# Patient Record
Sex: Male | Born: 1955 | Race: White | Hispanic: No | Marital: Married | Smoking: Former smoker
Health system: Southern US, Community
[De-identification: ages and names within clinical notes are randomized; demographics above are authoritative.]

---

## 2010-10-16 ENCOUNTER — Encounter: Admission: RE | Admit: 2010-10-16 | Discharge: 2010-10-16 | Payer: Self-pay | Admitting: Family Medicine

## 2010-10-29 ENCOUNTER — Ambulatory Visit (HOSPITAL_BASED_OUTPATIENT_CLINIC_OR_DEPARTMENT_OTHER): Admission: RE | Admit: 2010-10-29 | Discharge: 2010-10-29 | Payer: Self-pay | Admitting: Urology

## 2010-11-01 ENCOUNTER — Ambulatory Visit (HOSPITAL_COMMUNITY): Admission: RE | Admit: 2010-11-01 | Discharge: 2010-11-01 | Payer: Self-pay | Admitting: Urology

## 2010-11-01 ENCOUNTER — Ambulatory Visit: Payer: Self-pay | Admitting: Oncology

## 2010-11-06 LAB — CBC WITH DIFFERENTIAL/PLATELET
BASO%: 0.5 % (ref 0.0–2.0)
Basophils Absolute: 0 10*3/uL (ref 0.0–0.1)
HCT: 44.9 % (ref 38.4–49.9)
HGB: 15.3 g/dL (ref 13.0–17.1)
MCHC: 34.2 g/dL (ref 32.0–36.0)
MONO#: 0.6 10*3/uL (ref 0.1–0.9)
NEUT%: 50.7 % (ref 39.0–75.0)
RDW: 13.1 % (ref 11.0–14.6)
WBC: 6.7 10*3/uL (ref 4.0–10.3)
lymph#: 2.5 10*3/uL (ref 0.9–3.3)

## 2010-11-06 LAB — COMPREHENSIVE METABOLIC PANEL
ALT: 26 U/L (ref 0–53)
AST: 17 U/L (ref 0–37)
Albumin: 4.6 g/dL (ref 3.5–5.2)
CO2: 26 mEq/L (ref 19–32)
Calcium: 9.1 mg/dL (ref 8.4–10.5)
Chloride: 103 mEq/L (ref 96–112)
Creatinine, Ser: 0.86 mg/dL (ref 0.40–1.50)
Potassium: 4.1 mEq/L (ref 3.5–5.3)

## 2010-11-06 LAB — LACTATE DEHYDROGENASE: LDH: 132 U/L (ref 94–250)

## 2010-11-12 ENCOUNTER — Ambulatory Visit (HOSPITAL_COMMUNITY)
Admission: RE | Admit: 2010-11-12 | Discharge: 2010-11-12 | Payer: Self-pay | Source: Home / Self Care | Admitting: Oncology

## 2010-12-06 ENCOUNTER — Ambulatory Visit: Payer: Self-pay | Admitting: Oncology

## 2011-01-17 ENCOUNTER — Ambulatory Visit: Payer: Self-pay | Admitting: Oncology

## 2011-02-08 ENCOUNTER — Encounter (HOSPITAL_BASED_OUTPATIENT_CLINIC_OR_DEPARTMENT_OTHER): Payer: Self-pay | Admitting: Oncology

## 2011-02-08 DIAGNOSIS — Z5189 Encounter for other specified aftercare: Secondary | ICD-10-CM

## 2011-02-08 DIAGNOSIS — C679 Malignant neoplasm of bladder, unspecified: Secondary | ICD-10-CM

## 2011-02-14 IMAGING — CT CT ABD-PELV W/O CM
2 of 4 series · 10 of 36 positions shown, 17 images · non-contrast
Comparison: None.

CLINICAL DATA: Hematuria

CT ABDOMEN AND PELVIS WITHOUT CONTRAST
TECHNIQUE: Multidetector CT imaging of the abdomen and pelvis was
performed following the standard protocol without intravenous
contrast.

[Series 3: renal stone · axial · 0.66mm/px · z∈[-344,-14]mm · 9 of 80 slices shown, 15 images]
[im 8/80  soft-tissue]
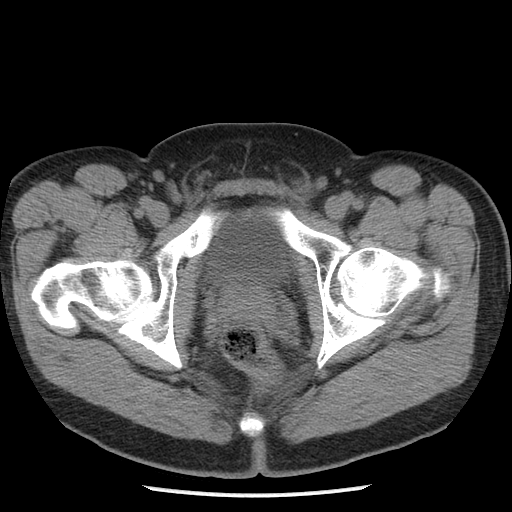
[im 8/80  bone]
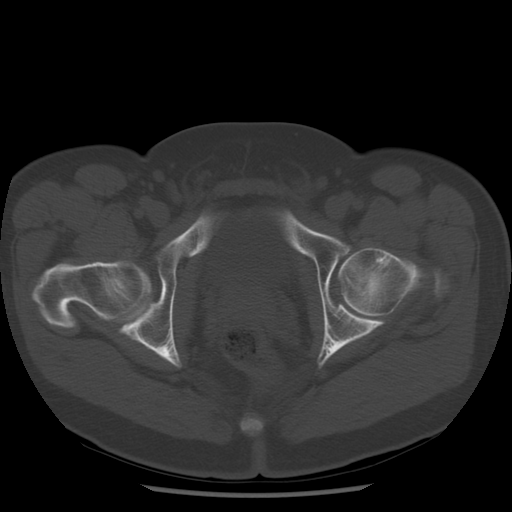
[im 16/80  soft-tissue]
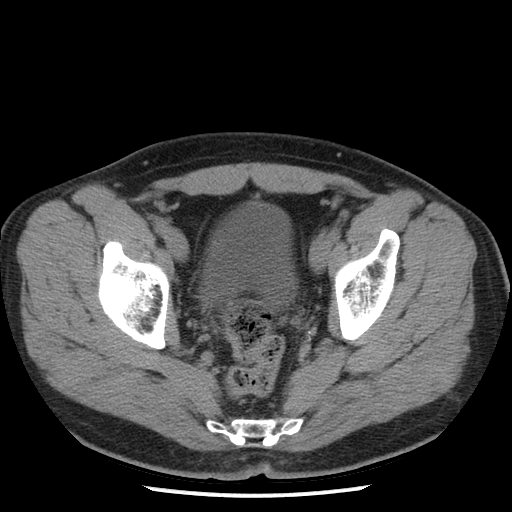
[im 24/80  soft-tissue]
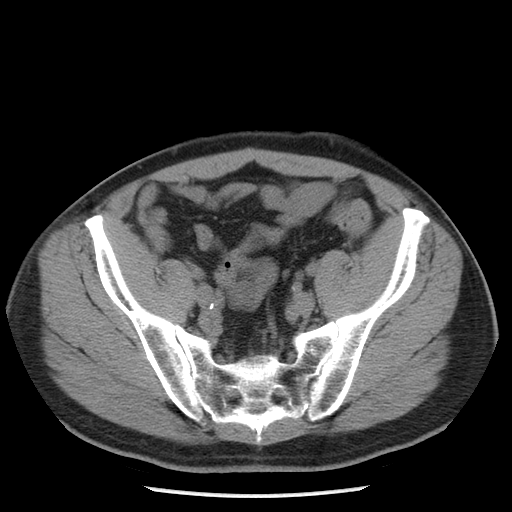
[im 32/80  soft-tissue]
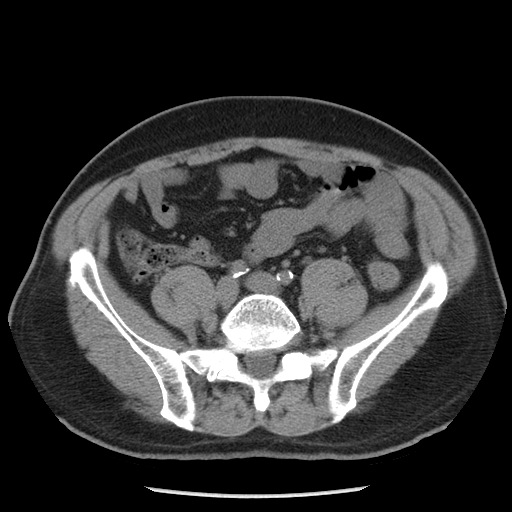
[im 40/80  soft-tissue]
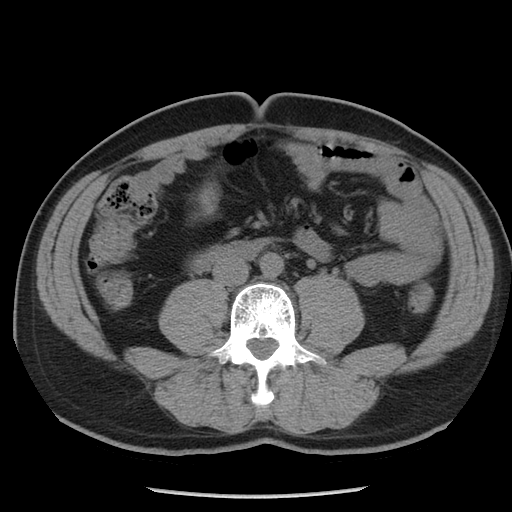
[im 48/80  soft-tissue]
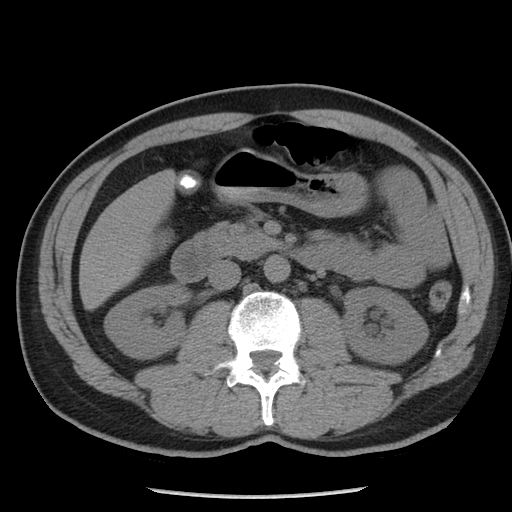
[im 48/80  lung]
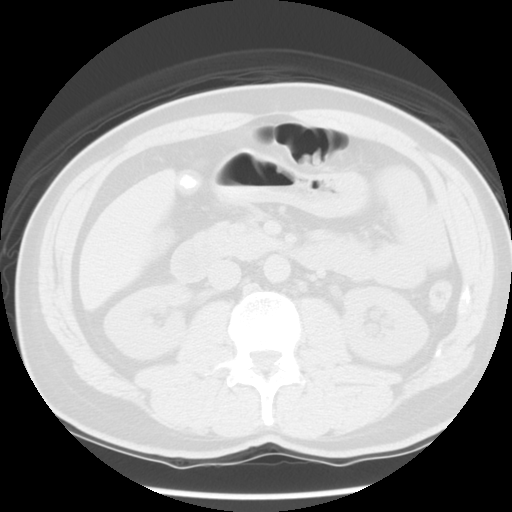
[im 56/80  soft-tissue]
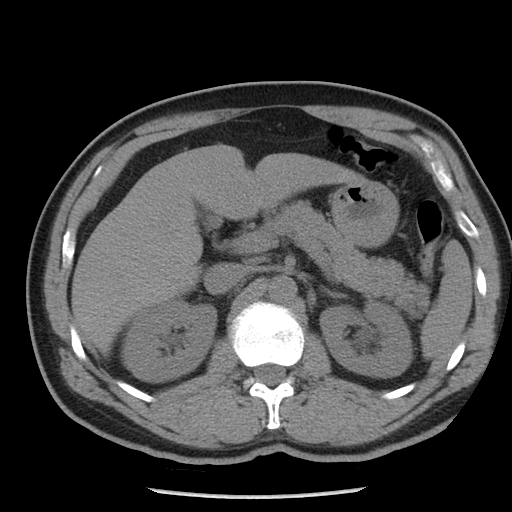
[im 56/80  lung]
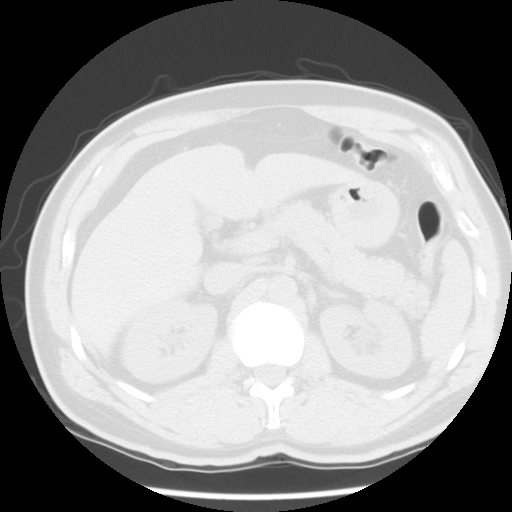
[im 64/80  soft-tissue]
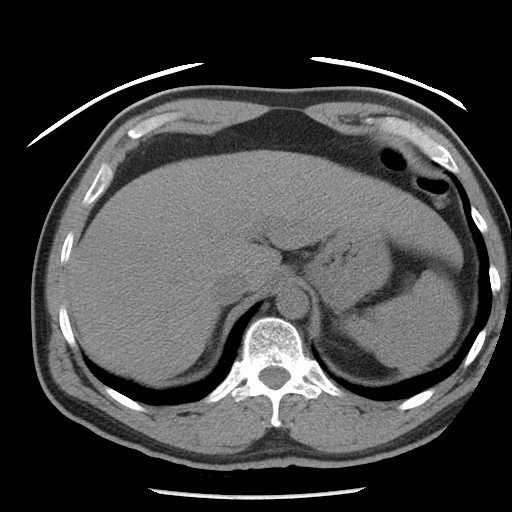
[im 64/80  lung]
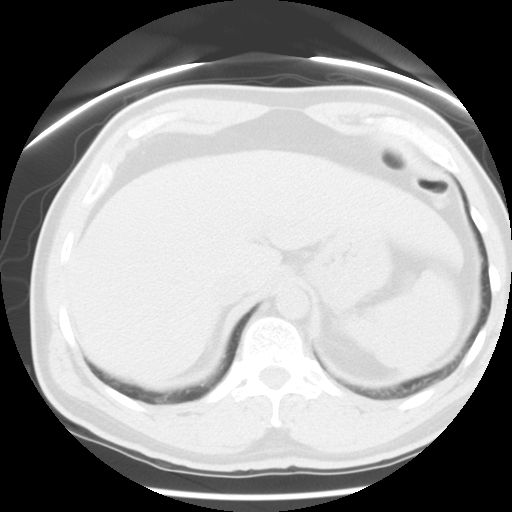
[im 72/80  soft-tissue]
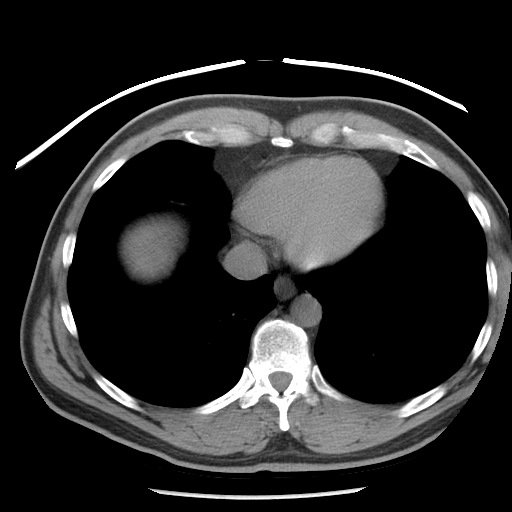
[im 72/80  lung]
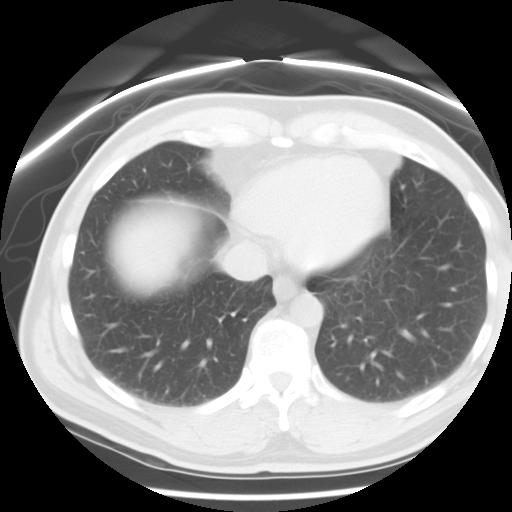
[im 72/80  bone]
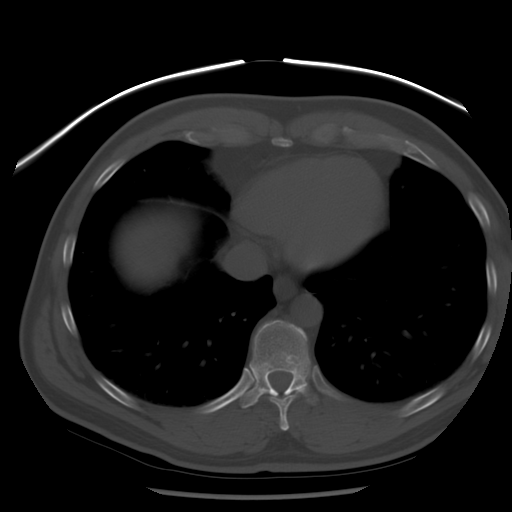

[Series 601: coronal body · coronal · 0.87mm/px · 1 of 109 slices shown, 2 images]
[im 37/109  soft-tissue]
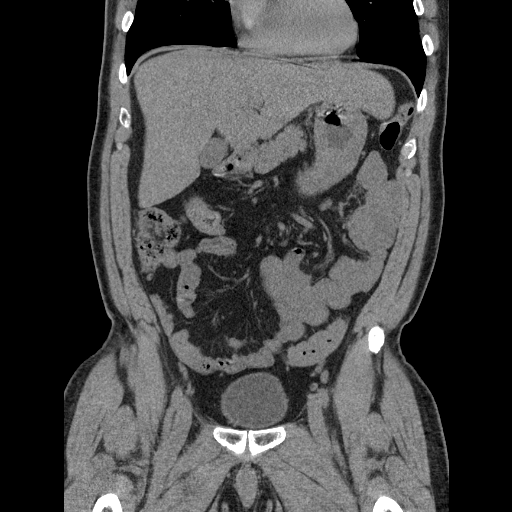
[im 37/109  bone]
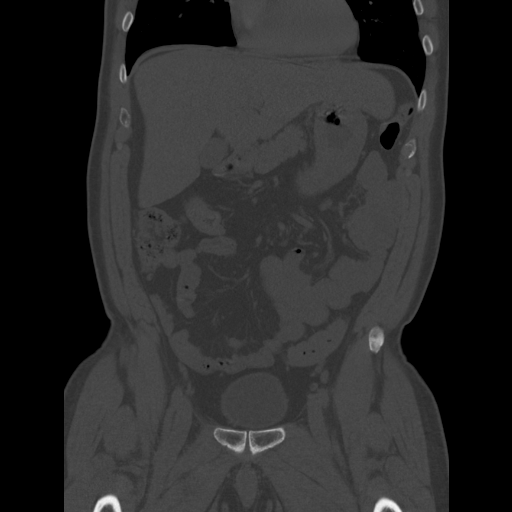

[10 of 36 positions shown; findings below may reference images not displayed]

FINDINGS: The lung bases are clear.  There are gallstones with
probable gallbladder wall thickening suggesting chronic
cholecystitis.  There are no renal calculi.  However there is mild
left hydronephrosis and hydroureter.  A moderately dilated left
ureter can be followed all the way down to the bladder, where there
are two stone fragments in the UVJ.  In addition, however, there is
mass effect in the region of the left UVJ.  This is best
appreciated in the coronal plane but is also present in the
sagittal and axial planes.  This may be a complex ureterocele
(Hounsfield units are about 30), edema of the UVJ, or a bladder
mass.  Cystoscopy would appear indicated for further assessment.

Other abdominal viscera unremarkable in the unenhanced state.
There is a aorto/iliac calcification without aneurysm.  No masses
or fluid collections.  No inflammatory changes of the large or
small bowel.  Appendix normal.
IMPRESSION: 1.  There is mild left hydronephrosis and hydroureter.  There are
two small stone fragments at the left UVJ.  In addition, however,
there is a mass effect in the region of the UVJ.  See above
discussion.
2.  Cholelithiasis with possible chronic cholecystitis.
3.  No other acute or significant findings.

## 2011-03-13 IMAGING — PT NM PET TUM IMG INITIAL (PI) SKULL BASE T - THIGH
1 of 6 series · 1 of 25 positions shown · non-contrast
Comparison: CT 10/18/2010

CLINICAL DATA: Initial treatment strategy for small cell bladder
cancer.  A.

NUCLEAR MEDICINE PET SKULL BASE TO THIGH
Fasting Blood Glucose:  98
TECHNIQUE: 15.0 mCi F-18 FDG was injected intravenously via the
right antecubital fossa.  Full-ring PET imaging was performed from
the skull base through the mid-thighs 70  minutes after injection.
CT data was obtained and used for attenuation correction and
anatomic localization only.  (This was not acquired as a diagnostic
CT examination.)

[Series 1: pet ac · axial · 3.3mm · 4.69mm/px · 1 of 267 slices shown]
[im 134/267]
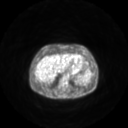

[1 of 25 positions shown; findings below may reference images not displayed]

FINDINGS: Neck:No hypermetabolic nodes in the neck.

Chest:No hypermetabolic mediastinal or hilar nodes.  No suspicious
pulmonary nodules.

Abdomen / Pelvis:No abnormal hypermetabolic activity within the
solid organs.  No evidence of abdominal or pelvic hypermetabolic
nodes.

Again demonstrated thickening within the posterior left bladder
wall.  Metabolic activity cannot be assessed due to high activity
of urine.

Skeleton:Review of  bone windows demonstrates no aggressive osseous
lesions.
IMPRESSION: No evidence of bladder cancer metastasis.

## 2015-06-13 ENCOUNTER — Other Ambulatory Visit: Payer: Self-pay | Admitting: Specialist

## 2018-02-09 DIAGNOSIS — H524 Presbyopia: Secondary | ICD-10-CM | POA: Diagnosis not present

## 2018-02-09 DIAGNOSIS — H52203 Unspecified astigmatism, bilateral: Secondary | ICD-10-CM | POA: Diagnosis not present

## 2018-02-22 DIAGNOSIS — G510 Bell's palsy: Secondary | ICD-10-CM | POA: Diagnosis not present

## 2018-02-22 DIAGNOSIS — J3089 Other allergic rhinitis: Secondary | ICD-10-CM | POA: Diagnosis not present

## 2018-02-23 DIAGNOSIS — L821 Other seborrheic keratosis: Secondary | ICD-10-CM | POA: Diagnosis not present

## 2018-02-23 DIAGNOSIS — D1801 Hemangioma of skin and subcutaneous tissue: Secondary | ICD-10-CM | POA: Diagnosis not present

## 2018-02-23 DIAGNOSIS — L570 Actinic keratosis: Secondary | ICD-10-CM | POA: Diagnosis not present

## 2018-02-23 DIAGNOSIS — D225 Melanocytic nevi of trunk: Secondary | ICD-10-CM | POA: Diagnosis not present

## 2018-03-10 DIAGNOSIS — J209 Acute bronchitis, unspecified: Secondary | ICD-10-CM | POA: Diagnosis not present

## 2018-06-25 DIAGNOSIS — M5416 Radiculopathy, lumbar region: Secondary | ICD-10-CM | POA: Diagnosis not present

## 2018-07-01 DIAGNOSIS — M5416 Radiculopathy, lumbar region: Secondary | ICD-10-CM | POA: Diagnosis not present

## 2018-07-02 DIAGNOSIS — M545 Low back pain: Secondary | ICD-10-CM | POA: Diagnosis not present

## 2018-07-02 DIAGNOSIS — M5416 Radiculopathy, lumbar region: Secondary | ICD-10-CM | POA: Diagnosis not present

## 2018-07-02 DIAGNOSIS — M6281 Muscle weakness (generalized): Secondary | ICD-10-CM | POA: Diagnosis not present

## 2018-07-06 DIAGNOSIS — M6281 Muscle weakness (generalized): Secondary | ICD-10-CM | POA: Diagnosis not present

## 2018-07-06 DIAGNOSIS — M545 Low back pain: Secondary | ICD-10-CM | POA: Diagnosis not present

## 2018-07-06 DIAGNOSIS — M5416 Radiculopathy, lumbar region: Secondary | ICD-10-CM | POA: Diagnosis not present

## 2018-07-09 DIAGNOSIS — M5416 Radiculopathy, lumbar region: Secondary | ICD-10-CM | POA: Diagnosis not present

## 2018-07-09 DIAGNOSIS — M6281 Muscle weakness (generalized): Secondary | ICD-10-CM | POA: Diagnosis not present

## 2018-07-09 DIAGNOSIS — M545 Low back pain: Secondary | ICD-10-CM | POA: Diagnosis not present

## 2018-07-12 DIAGNOSIS — M5416 Radiculopathy, lumbar region: Secondary | ICD-10-CM | POA: Diagnosis not present

## 2018-07-12 DIAGNOSIS — M545 Low back pain: Secondary | ICD-10-CM | POA: Diagnosis not present

## 2018-07-12 DIAGNOSIS — M6281 Muscle weakness (generalized): Secondary | ICD-10-CM | POA: Diagnosis not present

## 2018-07-14 DIAGNOSIS — M5416 Radiculopathy, lumbar region: Secondary | ICD-10-CM | POA: Diagnosis not present

## 2018-07-14 DIAGNOSIS — M6281 Muscle weakness (generalized): Secondary | ICD-10-CM | POA: Diagnosis not present

## 2018-07-14 DIAGNOSIS — M545 Low back pain: Secondary | ICD-10-CM | POA: Diagnosis not present

## 2018-07-16 DIAGNOSIS — M5416 Radiculopathy, lumbar region: Secondary | ICD-10-CM | POA: Diagnosis not present

## 2018-07-16 DIAGNOSIS — M6281 Muscle weakness (generalized): Secondary | ICD-10-CM | POA: Diagnosis not present

## 2018-07-16 DIAGNOSIS — M545 Low back pain: Secondary | ICD-10-CM | POA: Diagnosis not present

## 2018-07-23 DIAGNOSIS — M5416 Radiculopathy, lumbar region: Secondary | ICD-10-CM | POA: Diagnosis not present

## 2019-01-17 DIAGNOSIS — J00 Acute nasopharyngitis [common cold]: Secondary | ICD-10-CM | POA: Diagnosis not present

## 2019-02-14 DIAGNOSIS — H2513 Age-related nuclear cataract, bilateral: Secondary | ICD-10-CM | POA: Diagnosis not present

## 2019-02-14 DIAGNOSIS — H52203 Unspecified astigmatism, bilateral: Secondary | ICD-10-CM | POA: Diagnosis not present

## 2019-02-14 DIAGNOSIS — H524 Presbyopia: Secondary | ICD-10-CM | POA: Diagnosis not present

## 2019-02-16 DIAGNOSIS — D1801 Hemangioma of skin and subcutaneous tissue: Secondary | ICD-10-CM | POA: Diagnosis not present

## 2019-02-16 DIAGNOSIS — D225 Melanocytic nevi of trunk: Secondary | ICD-10-CM | POA: Diagnosis not present

## 2019-02-16 DIAGNOSIS — L821 Other seborrheic keratosis: Secondary | ICD-10-CM | POA: Diagnosis not present

## 2019-12-01 ENCOUNTER — Other Ambulatory Visit: Payer: Self-pay | Admitting: Family Medicine

## 2019-12-01 DIAGNOSIS — I739 Peripheral vascular disease, unspecified: Secondary | ICD-10-CM

## 2019-12-09 ENCOUNTER — Ambulatory Visit
Admission: RE | Admit: 2019-12-09 | Discharge: 2019-12-09 | Disposition: A | Discharge status: Home / Self Care | Payer: 59 | Source: Ambulatory Visit | Attending: Family Medicine | Admitting: Family Medicine

## 2019-12-09 ENCOUNTER — Other Ambulatory Visit: Payer: Self-pay

## 2019-12-09 DIAGNOSIS — I739 Peripheral vascular disease, unspecified: Secondary | ICD-10-CM

## 2020-03-09 ENCOUNTER — Ambulatory Visit: Payer: 59 | Attending: Internal Medicine

## 2020-03-09 DIAGNOSIS — Z23 Encounter for immunization: Secondary | ICD-10-CM

## 2020-03-09 NOTE — Progress Notes (Signed)
   Covid-19 Vaccination Clinic  Name:  Dennis Harrell    MRN: [id number] DOB: [birth year]  03/09/2020  [name] was observed post Covid-19 immunization for 15 minutes without incident. He was provided with Vaccine Information Sheet and instruction to access the V-Safe system.   [name] was instructed to call 911 with any severe reactions post vaccine: Marland Kitchen Difficulty breathing  . Swelling of face and throat  . A fast heartbeat  . A bad rash all over body  . Dizziness and weakness   Immunizations Administered    Name Date Dose VIS Date Route   Moderna COVID-19 Vaccine 03/09/2020  9:10 AM 0.5 mL 11/22/2019 Intramuscular   Manufacturer: Moderna   Lot: [id number]   [id number]

## 2020-04-10 ENCOUNTER — Ambulatory Visit: Payer: 59 | Attending: Internal Medicine

## 2020-04-10 DIAGNOSIS — Z23 Encounter for immunization: Secondary | ICD-10-CM

## 2020-04-10 NOTE — Progress Notes (Signed)
   Covid-19 Vaccination Clinic  Name:  Dennis Harrell    MRN: [id number] DOB: [birth year]  04/10/2020  [name] was observed post Covid-19 immunization for 15 minutes without incident. He was provided with Vaccine Information Sheet and instruction to access the V-Safe system.   [name] was instructed to call 911 with any severe reactions post vaccine: Marland Kitchen Difficulty breathing  . Swelling of face and throat  . A fast heartbeat  . A bad rash all over body  . Dizziness and weakness   Immunizations Administered    Name Date Dose VIS Date Route   Moderna COVID-19 Vaccine 04/10/2020  8:28 AM 0.5 mL 11/2019 Intramuscular   Manufacturer: Moderna   Lot: [id number]   [id number]

## 2020-10-04 ENCOUNTER — Ambulatory Visit: Payer: 59 | Attending: Internal Medicine

## 2020-10-04 DIAGNOSIS — Z23 Encounter for immunization: Secondary | ICD-10-CM

## 2020-10-04 NOTE — Progress Notes (Signed)
   Covid-19 Vaccination Clinic  Name:  Dennis Harrell    MRN: [id number] DOB: 1956/09/23  10/04/2020  Dennis Harrell was observed post Covid-19 immunization for 15 minutes without incident. He was provided with Vaccine Information Sheet and instruction to access the V-Safe system.   Dennis Harrell was instructed to call 911 with any severe reactions post vaccine: Marland Kitchen Difficulty breathing  . Swelling of face and throat  . A fast heartbeat  . A bad rash all over body  . Dizziness and weakness

## 2021-01-31 ENCOUNTER — Ambulatory Visit: Payer: 59 | Attending: Internal Medicine

## 2021-01-31 DIAGNOSIS — Z23 Encounter for immunization: Secondary | ICD-10-CM

## 2021-01-31 NOTE — Progress Notes (Signed)
   Covid-19 Vaccination Clinic  Name:  Dennis Harrell    MRN: [id number] DOB: 07-14-1956  01/31/2021  [name] was observed post Covid-19 immunization for 15 minutes without incident. He was provided with Vaccine Information Sheet and instruction to access the V-Safe system.   [name] was instructed to call 911 with any severe reactions post vaccine: Marland Kitchen Difficulty breathing  . Swelling of face and throat  . A fast heartbeat  . A bad rash all over body  . Dizziness and weakness   Immunizations Administered    Name Date Dose VIS Date Route   Moderna Covid-19 Booster Vaccine 01/31/2021  1:17 PM 0.25 mL 10/10/2020 Intramuscular   Manufacturer: Moderna   Lot: [id number]   [place]: [id number]

## 2022-09-01 ENCOUNTER — Ambulatory Visit
Admission: EM | Admit: 2022-09-01 | Discharge: 2022-09-01 | Disposition: A | Discharge status: Home / Self Care | Payer: 59 | Attending: Nurse Practitioner | Admitting: Nurse Practitioner

## 2022-09-01 DIAGNOSIS — K0889 Other specified disorders of teeth and supporting structures: Secondary | ICD-10-CM

## 2022-09-01 DIAGNOSIS — J0100 Acute maxillary sinusitis, unspecified: Secondary | ICD-10-CM | POA: Diagnosis not present

## 2022-09-01 MED ORDER — AMOXICILLIN-POT CLAVULANATE 875-125 MG PO TABS: 1.0000 | ORAL_TABLET | Freq: Two times a day (BID) | ORAL | 0 refills | Status: AC

## 2022-09-01 NOTE — ED Triage Notes (Signed)
Pt presents with right side facial pain and foul smelling nasal drainage for past 2 days

## 2022-09-01 NOTE — ED Provider Notes (Signed)
[facility]    CSN: [id number] Arrival date & time: 09/01/22  1228      History   Chief Complaint Chief Complaint  Patient presents with   Facial Pain   Nasal Congestion    HPI Dennis Harrell is a 66 y.o. male.   Patient presents with 2 days of nasal congestion on the right side, sinus pressure on the right side, right-sided headache, tooth pain, and pain in his face lower side.  Also having bilateral ear pressure, some chest congestion.  Reports recently when he was at the dentist, it was noted that his molars are moving up into his sinus cavity.  He denies any fever, cough, shortness of breath or wheezing, chest pain or tightness, runny nose, sore throat, swollen glands, ear pain, abdominal pain, nausea/vomiting, diarrhea, decreased appetite, fatigue, and new rash.  No recent sick contacts that he knows of.  Has not take anything for symptoms so far.  Reports about a year ago, with similar symptoms occurred, he was treated with Augmentin that fully improved his symptoms.  Recently had a dental procedure on the left side of his mouth.     History reviewed. No pertinent past medical history.  There are no problems to display for this patient.   History reviewed. No pertinent surgical history.     Home Medications    Prior to Admission medications   Medication Sig Start Date End Date Taking? Authorizing Provider  amoxicillin-clavulanate (AUGMENTIN) 875-125 MG tablet Take 1 tablet by mouth 2 (two) times daily for 7 days. 09/01/22 09/08/22 Yes Eulogio Bear, NP    Family History Family History  Family history unknown: Yes    Social History Social History   Tobacco Use   Smoking status: Former    Types: Cigarettes   Smokeless tobacco: Never     Allergies   Clindamycin/lincomycin   Review of Systems Review of Systems Per HPI  Physical Exam Triage Vital Signs ED Triage Vitals  Enc Vitals Group     BP 09/01/22 1340 108/72     Pulse Rate  09/01/22 1340 79     Resp 09/01/22 1340 18     Temp 09/01/22 1340 98.6 F (37 C)     Temp src --      SpO2 09/01/22 1340 94 %     Weight --      Height --      Head Circumference --      Peak Flow --      Pain Score 09/01/22 1336 7     Pain Loc --      Pain Edu? --      Excl. in [place]? --    No data found.  Updated Vital Signs BP 108/72   Pulse 79   Temp 98.6 F (37 C)   Resp 18   SpO2 94%   Visual Acuity Right Eye Distance:   Left Eye Distance:   Bilateral Distance:    Right Eye Near:   Left Eye Near:    Bilateral Near:     Physical Exam Vitals and nursing note reviewed.  Constitutional:      General: He is not in acute distress.    Appearance: Normal appearance. He is not toxic-appearing.  HENT:     Head: Normocephalic and atraumatic.     Right Ear: Tympanic membrane, ear canal and external ear normal. There is no impacted cerumen. Tympanic membrane is not perforated, erythematous or retracted.  Left Ear: Tympanic membrane, ear canal and external ear normal. There is no impacted cerumen. Tympanic membrane is not perforated, erythematous or retracted.     Nose: No congestion or rhinorrhea.     Right Sinus: Maxillary sinus tenderness and frontal sinus tenderness present.     Left Sinus: No maxillary sinus tenderness or frontal sinus tenderness.     Mouth/Throat:     Mouth: Mucous membranes are moist.     Pharynx: Oropharynx is clear. No posterior oropharyngeal erythema.     Tonsils: No tonsillar exudate or tonsillar abscesses. 0 on the right. 0 on the left.  Eyes:     General: No scleral icterus.    Extraocular Movements: Extraocular movements intact.  Cardiovascular:     Rate and Rhythm: Normal rate and regular rhythm.  Pulmonary:     Effort: Pulmonary effort is normal. No respiratory distress.     Breath sounds: Normal breath sounds. No wheezing, rhonchi or rales.  Musculoskeletal:     Cervical back: Normal range of motion.  Lymphadenopathy:      Cervical: No cervical adenopathy.  Skin:    General: Skin is warm and dry.     Capillary Refill: Capillary refill takes less than 2 seconds.     Coloration: Skin is not jaundiced or pale.     Findings: No erythema.  Neurological:     Mental Status: He is alert and oriented to person, place, and time.  Psychiatric:        Behavior: Behavior is cooperative.      UC Treatments / Results  Labs (all labs ordered are listed, but only abnormal results are displayed) Labs Reviewed - No data to display  EKG   Radiology No results found.  Procedures Procedures (including critical care time)  Medications Ordered in UC Medications - No data to display  Initial Impression / Assessment and Plan / UC Course  I have reviewed the triage vital signs and the nursing notes.  Pertinent labs & imaging results that were available during my care of the patient were reviewed by me and considered in my medical decision making (see chart for details).    Patient is well-appearing, normotensive, afebrile, not tachycardic, not tachypneic, oxygenating well on room air.  I am concerned about a possible dental infection impeding upon the sinus cavity.  Recommended starting Augmentin twice daily for 7 days.  Also recommended following up with dentist if symptoms do not improve with this treatment.  Discussed that if symptoms are only in sinuses, would likely be a viral sinus infection.  Supportive care discussed.  The patient was given the opportunity to ask questions.  All questions answered to their satisfaction.  The patient is in agreement to this plan.   Final Clinical Impressions(s) / UC Diagnoses   Final diagnoses:  Pain, dental  Acute non-recurrent maxillary sinusitis     Discharge Instructions      - I am concerned that there may be a dental infection causing your symptoms.  Please start the Augmentin and take the entire course as prescribed.  Plan to follow up with your dentist if symptoms  do not improve.   Some other things that can make you feel better are: - Increasing fluid with water/sugar free electrolytes - Acetaminophen and ibuprofen as needed for fever/pain.  - OTC guaifenesin (Mucinex) 600 mg twice daily.  - Saline sinus flushes or a neti pot.  - Humidifying the air.    ED Prescriptions     Medication  Sig Dispense Auth. Provider   amoxicillin-clavulanate (AUGMENTIN) 875-125 MG tablet Take 1 tablet by mouth 2 (two) times daily for 7 days. 14 tablet Eulogio Bear, NP      PDMP not reviewed this encounter.   Eulogio Bear, NP 09/01/22 [id number]

## 2022-09-01 NOTE — Discharge Instructions (Addendum)
-   I am concerned that there may be a dental infection causing your symptoms.  Please start the Augmentin and take the entire course as prescribed.  Plan to follow up with your dentist if symptoms do not improve.   Some other things that can make you feel better are: - Increasing fluid with water/sugar free electrolytes - Acetaminophen and ibuprofen as needed for fever/pain.  - OTC guaifenesin (Mucinex) 600 mg twice daily.  - Saline sinus flushes or a neti pot.  - Humidifying the air.

## 2022-11-09 ENCOUNTER — Other Ambulatory Visit: Payer: Self-pay

## 2022-11-09 ENCOUNTER — Encounter: Payer: Self-pay | Admitting: Emergency Medicine

## 2022-11-09 ENCOUNTER — Ambulatory Visit
Admission: EM | Admit: 2022-11-09 | Discharge: 2022-11-09 | Disposition: A | Discharge status: Home / Self Care | Payer: 59 | Attending: Family Medicine | Admitting: Family Medicine

## 2022-11-09 DIAGNOSIS — B9789 Other viral agents as the cause of diseases classified elsewhere: Secondary | ICD-10-CM | POA: Diagnosis present

## 2022-11-09 DIAGNOSIS — J329 Chronic sinusitis, unspecified: Secondary | ICD-10-CM | POA: Diagnosis present

## 2022-11-09 DIAGNOSIS — Z1152 Encounter for screening for COVID-19: Secondary | ICD-10-CM | POA: Diagnosis not present

## 2022-11-09 LAB — RESP PANEL BY RT-PCR (FLU A&B, COVID) ARPGX2
Influenza A by PCR: NEGATIVE
Influenza B by PCR: NEGATIVE
SARS Coronavirus 2 by RT PCR: NEGATIVE

## 2022-11-09 NOTE — Discharge Instructions (Signed)
Take over-the-counter medications such as DayQuil, NyQuil, Flonase nasal spray twice daily, perform saline sinus rinses several times daily until feeling better.  COVID and flu test should be back tomorrow or the next day

## 2022-11-09 NOTE — ED Triage Notes (Signed)
Pt reports right ear pain, and right sided facial pressure, nasal congestion, sore throat since Friday. Pt reports tylenol and ibuprofen have helped. Pt inquired about flonase.

## 2022-11-09 NOTE — ED Provider Notes (Signed)
[facility]    CSN: [id number] Arrival date & time: 11/09/22  1005      History   Chief Complaint Chief Complaint  Patient presents with   Ear Pain    HPI Dennis Harrell is a 66 y.o. male.   Patient presenting today with 3-day history of right ear pain, right-sided sinus pressure, nasal congestion, sore throat, mild cough from drainage, body aches, low-grade fever.  Denies chest pain, shortness of breath, abdominal pain, nausea vomiting or diarrhea.  So far trying Tylenol with minimal relief of symptoms.  No known history of chronic pulmonary disease.    History reviewed. No pertinent past medical history.  There are no problems to display for this patient.   History reviewed. No pertinent surgical history.     Home Medications    Prior to Admission medications   Not on File    Family History Family History  Family history unknown: Yes    Social History Social History   Tobacco Use   Smoking status: Former    Types: Cigarettes   Smokeless tobacco: Never  Substance Use Topics   Alcohol use: Yes    Comment: occ     Allergies   Clindamycin/lincomycin   Review of Systems Review of Systems Per HPI  Physical Exam Triage Vital Signs ED Triage Vitals  Enc Vitals Group     BP 11/09/22 1206 122/76     Pulse Rate 11/09/22 1206 85     Resp 11/09/22 1206 20     Temp 11/09/22 1206 99.9 F (37.7 C)     Temp Source 11/09/22 1206 Oral     SpO2 11/09/22 1206 94 %     Weight --      Height --      Head Circumference --      Peak Flow --      Pain Score 11/09/22 1207 9     Pain Loc --      Pain Edu? --      Excl. in [place]? --    No data found.  Updated Vital Signs BP 122/76 (BP Location: Right Arm)   Pulse 85   Temp 99.9 F (37.7 C) (Oral)   Resp 20   SpO2 94%   Visual Acuity Right Eye Distance:   Left Eye Distance:   Bilateral Distance:    Right Eye Near:   Left Eye Near:    Bilateral Near:     Physical Exam Vitals and  nursing note reviewed.  Constitutional:      Appearance: He is well-developed.  HENT:     Head: Atraumatic.     Right Ear: External ear normal.     Left Ear: External ear normal.     Ears:     Comments: Mild middle ear effusions bilaterally    Nose: Rhinorrhea present.     Mouth/Throat:     Pharynx: Posterior oropharyngeal erythema present. No oropharyngeal exudate.     Comments: Postnasal drainage pattern Eyes:     Conjunctiva/sclera: Conjunctivae normal.     Pupils: Pupils are equal, round, and reactive to light.  Cardiovascular:     Rate and Rhythm: Normal rate and regular rhythm.  Pulmonary:     Effort: Pulmonary effort is normal. No respiratory distress.     Breath sounds: No wheezing or rales.  Musculoskeletal:        General: Normal range of motion.     Cervical back: Normal range of motion and neck supple.  Lymphadenopathy:     Cervical: No cervical adenopathy.  Skin:    General: Skin is warm and dry.  Neurological:     Mental Status: He is alert and oriented to person, place, and time.  Psychiatric:        Behavior: Behavior normal.      UC Treatments / Results  Labs (all labs ordered are listed, but only abnormal results are displayed) Labs Reviewed  RESP PANEL BY RT-PCR (FLU A&B, COVID) [id number]    EKG   Radiology No results found.  Procedures Procedures (including critical care time)  Medications Ordered in UC Medications - No data to display  Initial Impression / Assessment and Plan / UC Course  I have reviewed the triage vital signs and the nursing notes.  Pertinent labs & imaging results that were available during my care of the patient were reviewed by me and considered in my medical decision making (see chart for details).     Suspect viral sinusitis, respiratory panel pending, treat with Flonase, over-the-counter cold and congestion medications, sinus rinses, humidifiers.  Discussed supportive home care and return precautions.  Final  Clinical Impressions(s) / UC Diagnoses   Final diagnoses:  Viral sinusitis     Discharge Instructions      Take over-the-counter medications such as DayQuil, NyQuil, Flonase nasal spray twice daily, perform saline sinus rinses several times daily until feeling better.  COVID and flu test should be back tomorrow or the next day    ED Prescriptions   None    PDMP not reviewed this encounter.   Volney American, [place] 11/09/22 1242

## 2024-11-28 ENCOUNTER — Other Ambulatory Visit: Payer: Self-pay | Admitting: Student

## 2024-11-28 DIAGNOSIS — Z87891 Personal history of nicotine dependence: Secondary | ICD-10-CM

## 2024-12-06 ENCOUNTER — Inpatient Hospital Stay
Admission: RE | Admit: 2024-12-06 | Discharge: 2024-12-06 | Disposition: A | Source: Ambulatory Visit | Attending: Student

## 2024-12-06 DIAGNOSIS — Z87891 Personal history of nicotine dependence: Secondary | ICD-10-CM
